# Patient Record
Sex: Male | Born: 1970 | Race: White | Hispanic: No | Marital: Married | Smoking: Never smoker
Health system: Southern US, Community
[De-identification: ages and names within clinical notes are randomized; demographics above are authoritative.]

## PROBLEM LIST (undated history)

## (undated) DIAGNOSIS — I10 Essential (primary) hypertension: Secondary | ICD-10-CM

## (undated) DIAGNOSIS — Z9889 Other specified postprocedural states: Secondary | ICD-10-CM

## (undated) DIAGNOSIS — M1990 Unspecified osteoarthritis, unspecified site: Secondary | ICD-10-CM

## (undated) DIAGNOSIS — M94269 Chondromalacia, unspecified knee: Secondary | ICD-10-CM

## (undated) DIAGNOSIS — R112 Nausea with vomiting, unspecified: Secondary | ICD-10-CM

## (undated) DIAGNOSIS — S83249A Other tear of medial meniscus, current injury, unspecified knee, initial encounter: Secondary | ICD-10-CM

## (undated) HISTORY — PX: APPENDECTOMY: SHX54

## (undated) HISTORY — PX: INGUINAL HERNIA REPAIR: SHX194

## (undated) HISTORY — PX: UMBILICAL HERNIA REPAIR: SHX196

---

## 1997-12-20 ENCOUNTER — Encounter: Payer: Self-pay | Admitting: *Deleted

## 1997-12-20 ENCOUNTER — Ambulatory Visit (HOSPITAL_COMMUNITY): Admission: RE | Admit: 1997-12-20 | Discharge: 1997-12-21 | Payer: Self-pay | Admitting: *Deleted

## 1998-03-03 HISTORY — PX: BACK SURGERY: SHX140

## 2011-09-29 ENCOUNTER — Ambulatory Visit: Payer: Self-pay

## 2011-09-29 ENCOUNTER — Other Ambulatory Visit: Payer: Self-pay | Admitting: Family Medicine

## 2011-09-29 DIAGNOSIS — M25562 Pain in left knee: Secondary | ICD-10-CM

## 2011-10-02 DIAGNOSIS — S83249A Other tear of medial meniscus, current injury, unspecified knee, initial encounter: Secondary | ICD-10-CM

## 2011-10-02 DIAGNOSIS — M94269 Chondromalacia, unspecified knee: Secondary | ICD-10-CM

## 2011-10-02 HISTORY — DX: Chondromalacia, unspecified knee: M94.269

## 2011-10-02 HISTORY — DX: Other tear of medial meniscus, current injury, unspecified knee, initial encounter: S83.249A

## 2011-10-30 ENCOUNTER — Encounter (HOSPITAL_BASED_OUTPATIENT_CLINIC_OR_DEPARTMENT_OTHER): Payer: Self-pay | Admitting: *Deleted

## 2011-10-30 NOTE — Pre-Procedure Instructions (Signed)
Will do BMET and EKG DOS as pt. lives out of town

## 2011-11-04 NOTE — H&P (Signed)
Robert Munoz is an 41 y.o. male.   Chief Complaint: Left knee pain HPI: Patient returns today reporting that the stiffness in his left knee has improved but he continues have significant pain despite the use of the hinged brace.  An MRI scan is been accomplished showing extrusion of the medial meniscus and delamination of some of the cartilage off the medial femoral condyle.  The cortisone injection that was done on 02 October 2011 gave him good relief for about a week but now the pain has returned.  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    Past Medical History  Diagnosis Date  . Arthritis     left knee  . Hypertension     under control, has been on med. since age 67  . PONV (postoperative nausea and vomiting)   . Medial meniscus tear 10/2011    left  . 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 Back surgery 2000    lumbar  . Appendectomy age 31  . Inguinal hernia repair as a child  . Umbilical hernia repair 15 yrs. ago    History reviewed. No pertinent family history. Social History:  reports that he has never smoked. His smokeless tobacco use includes Snuff. He reports that he does not drink alcohol or use illicit drugs.  Allergies: No Known Allergies  No prescriptions prior to admission    No results found for this or any previous visit (from the past 48 hour(s)). No results found.  Review of Systems  Constitutional: Negative.   HENT: Negative.   Eyes: Negative.   Musculoskeletal: Positive for joint pain.  Skin: Negative.   All other systems reviewed and are negative.    Height 6\' 2"  (1.88 m), weight 140.615 kg (310 lb). Physical Exam  Constitutional: He is oriented to person, place, and time. He appears well-developed and well-nourished.  HENT:  Head: Normocephalic.  Eyes: Pupils are equal, round, and reactive to light.  Cardiovascular: Normal heart sounds.     Respiratory: Breath sounds normal.  GI: Soft.  Musculoskeletal: He exhibits tenderness.  Neurological: He is alert and oriented to person, place, and time.    Very tender along the medial joint line of the left knee, no palpable effusion, McMurray's test reproduces pain and there is crepitus as you taken through a range of motion  Assessment/Plan A: Symptomatic extrusion and tear of medial meniscus of left knee with delamination of the articular cartilage off the medial femoral condyle secondary to an injury at that occurred at work.  P: I am asking permission for arthroscopic evaluation and treatment of the left knee with removal of delaminated and torn cartilage, as well as probable debridement of the extruded medial meniscal tear.  In the meantime he'll remain at seated work with frequent changes in position.  He we'll continue Advil as an anti-inflammatory medication.  Micayla Brathwaite M. 11/04/2011, 10:42 AM

## 2011-11-05 ENCOUNTER — Encounter (HOSPITAL_BASED_OUTPATIENT_CLINIC_OR_DEPARTMENT_OTHER): Payer: Self-pay | Admitting: *Deleted

## 2011-11-05 ENCOUNTER — Encounter (HOSPITAL_BASED_OUTPATIENT_CLINIC_OR_DEPARTMENT_OTHER): Payer: Self-pay | Admitting: Anesthesiology

## 2011-11-05 ENCOUNTER — Ambulatory Visit (HOSPITAL_BASED_OUTPATIENT_CLINIC_OR_DEPARTMENT_OTHER)
Admission: RE | Admit: 2011-11-05 | Discharge: 2011-11-05 | Disposition: A | Discharge status: Home / Self Care | Payer: Worker's Compensation | Source: Ambulatory Visit | Attending: Orthopedic Surgery | Admitting: Orthopedic Surgery

## 2011-11-05 ENCOUNTER — Ambulatory Visit (HOSPITAL_BASED_OUTPATIENT_CLINIC_OR_DEPARTMENT_OTHER): Payer: Worker's Compensation | Admitting: Anesthesiology

## 2011-11-05 ENCOUNTER — Encounter (HOSPITAL_BASED_OUTPATIENT_CLINIC_OR_DEPARTMENT_OTHER)
Admission: RE | Disposition: A | Discharge status: Home / Self Care | Payer: Self-pay | Source: Ambulatory Visit | Attending: Orthopedic Surgery

## 2011-11-05 DIAGNOSIS — I10 Essential (primary) hypertension: Secondary | ICD-10-CM | POA: Insufficient documentation

## 2011-11-05 DIAGNOSIS — M2240 Chondromalacia patellae, unspecified knee: Secondary | ICD-10-CM | POA: Insufficient documentation

## 2011-11-05 DIAGNOSIS — M2340 Loose body in knee, unspecified knee: Secondary | ICD-10-CM | POA: Insufficient documentation

## 2011-11-05 DIAGNOSIS — Z9089 Acquired absence of other organs: Secondary | ICD-10-CM | POA: Insufficient documentation

## 2011-11-05 DIAGNOSIS — S83209A Unspecified tear of unspecified meniscus, current injury, unspecified knee, initial encounter: Secondary | ICD-10-CM

## 2011-11-05 DIAGNOSIS — M129 Arthropathy, unspecified: Secondary | ICD-10-CM | POA: Insufficient documentation

## 2011-11-05 HISTORY — DX: Essential (primary) hypertension: I10

## 2011-11-05 HISTORY — DX: Nausea with vomiting, unspecified: R11.2

## 2011-11-05 HISTORY — PX: CHONDROPLASTY: SHX5177

## 2011-11-05 HISTORY — PX: KNEE ARTHROSCOPY: SHX127

## 2011-11-05 HISTORY — DX: Other tear of medial meniscus, current injury, unspecified knee, initial encounter: S83.249A

## 2011-11-05 HISTORY — DX: Other specified postprocedural states: Z98.890

## 2011-11-05 HISTORY — DX: Unspecified osteoarthritis, unspecified site: M19.90

## 2011-11-05 HISTORY — DX: Chondromalacia, unspecified knee: M94.269

## 2011-11-05 LAB — POCT I-STAT, CHEM 8
Calcium, Ion: 1.13 mmol/L (ref 1.12–1.23)
Chloride: 103 mEq/L (ref 96–112)
HCT: 43 % (ref 39.0–52.0)
Potassium: 3.1 mEq/L — ABNORMAL LOW (ref 3.5–5.1)
Sodium: 140 mEq/L (ref 135–145)

## 2011-11-05 SURGERY — ARTHROSCOPY, KNEE
Anesthesia: General | Site: Knee | Laterality: Left | Wound class: Clean

## 2011-11-05 MED ORDER — HYDROCODONE-ACETAMINOPHEN 5-325 MG PO TABS: 1.0000 | ORAL_TABLET | ORAL | Status: AC | PRN

## 2011-11-05 MED ORDER — DEXAMETHASONE SODIUM PHOSPHATE 4 MG/ML IJ SOLN
INTRAMUSCULAR | Status: DC | PRN
  Administered 2011-11-05: 10 mg via INTRAVENOUS

## 2011-11-05 MED ORDER — MIDAZOLAM HCL 5 MG/5ML IJ SOLN
INTRAMUSCULAR | Status: DC | PRN
  Administered 2011-11-05: 2 mg via INTRAVENOUS

## 2011-11-05 MED ORDER — METOCLOPRAMIDE HCL 5 MG/ML IJ SOLN: 10.0000 mg | Freq: Once | INTRAMUSCULAR | Status: DC | PRN

## 2011-11-05 MED ORDER — BUPIVACAINE-EPINEPHRINE 0.5% -1:200000 IJ SOLN
INTRAMUSCULAR | Status: DC | PRN
  Administered 2011-11-05: 20 mL

## 2011-11-05 MED ORDER — LIDOCAINE HCL (CARDIAC) 20 MG/ML IV SOLN
INTRAVENOUS | Status: DC | PRN
  Administered 2011-11-05: 75 mg via INTRAVENOUS
  Administered 2011-11-05: 50 mg via INTRAVENOUS

## 2011-11-05 MED ORDER — FENTANYL CITRATE 0.05 MG/ML IJ SOLN
INTRAMUSCULAR | Status: DC | PRN
  Administered 2011-11-05: 50 ug via INTRAVENOUS
  Administered 2011-11-05: 100 ug via INTRAVENOUS

## 2011-11-05 MED ORDER — OXYCODONE HCL 5 MG PO TABS: 5.0000 mg | ORAL_TABLET | Freq: Once | ORAL | Status: DC | PRN

## 2011-11-05 MED ORDER — LACTATED RINGERS IV SOLN
INTRAVENOUS | Status: DC
  Administered 2011-11-05 (×2): via INTRAVENOUS

## 2011-11-05 MED ORDER — SODIUM CHLORIDE 0.9 % IR SOLN
Status: DC | PRN
  Administered 2011-11-05: 12:00:00

## 2011-11-05 MED ORDER — ONDANSETRON HCL 4 MG/2ML IJ SOLN
INTRAMUSCULAR | Status: DC | PRN
  Administered 2011-11-05: 4 mg via INTRAVENOUS

## 2011-11-05 MED ORDER — PROPOFOL 10 MG/ML IV BOLUS
INTRAVENOUS | Status: DC | PRN
  Administered 2011-11-05: 300 mg via INTRAVENOUS

## 2011-11-05 MED ORDER — OXYCODONE HCL 5 MG/5ML PO SOLN: 5.0000 mg | Freq: Once | ORAL | Status: DC | PRN

## 2011-11-05 MED ORDER — HYDROMORPHONE HCL PF 1 MG/ML IJ SOLN
0.2500 mg | INTRAMUSCULAR | Status: DC | PRN
  Administered 2011-11-05: 0.5 mg via INTRAVENOUS

## 2011-11-05 MED ORDER — SCOPOLAMINE 1 MG/3DAYS TD PT72
1.0000 | MEDICATED_PATCH | Freq: Once | TRANSDERMAL | Status: DC
  Administered 2011-11-05: 1.5 mg via TRANSDERMAL

## 2011-11-05 SURGICAL SUPPLY — 43 items
BANDAGE ELASTIC 6 VELCRO ST LF (GAUZE/BANDAGES/DRESSINGS) ×2 IMPLANT
BLADE 4.2CUDA (BLADE) IMPLANT
BLADE CUTTER GATOR 3.5 (BLADE) ×1 IMPLANT
BLADE GREAT WHITE 4.2 (BLADE) IMPLANT
CANISTER OMNI JUG 16 LITER (MISCELLANEOUS) ×1 IMPLANT
CANISTER SUCTION 2500CC (MISCELLANEOUS) IMPLANT
CHLORAPREP W/TINT 26ML (MISCELLANEOUS) ×2 IMPLANT
CLOTH BEACON ORANGE TIMEOUT ST (SAFETY) ×2 IMPLANT
DRAPE ARTHROSCOPY W/POUCH 114 (DRAPES) ×2 IMPLANT
ELECT MENISCUS 165MM 90D (ELECTRODE) IMPLANT
ELECT REM PT RETURN 9FT ADLT (ELECTROSURGICAL)
ELECTRODE REM PT RTRN 9FT ADLT (ELECTROSURGICAL) IMPLANT
GAUZE XEROFORM 1X8 LF (GAUZE/BANDAGES/DRESSINGS) ×2 IMPLANT
GLOVE BIO SURGEON STRL SZ 6.5 (GLOVE) ×1 IMPLANT
GLOVE BIO SURGEON STRL SZ7 (GLOVE) ×2 IMPLANT
GLOVE BIO SURGEON STRL SZ7.5 (GLOVE) ×2 IMPLANT
GLOVE BIOGEL PI IND STRL 6.5 (GLOVE) IMPLANT
GLOVE BIOGEL PI IND STRL 7.0 (GLOVE) ×1 IMPLANT
GLOVE BIOGEL PI IND STRL 8 (GLOVE) ×1 IMPLANT
GLOVE BIOGEL PI INDICATOR 6.5 (GLOVE) ×1
GLOVE BIOGEL PI INDICATOR 7.0 (GLOVE) ×1
GLOVE BIOGEL PI INDICATOR 8 (GLOVE) ×1
GLOVE SKINSENSE NS SZ7.0 (GLOVE) ×1
GLOVE SKINSENSE STRL SZ7.0 (GLOVE) IMPLANT
GOWN PREVENTION PLUS XLARGE (GOWN DISPOSABLE) ×4 IMPLANT
KNEE WRAP E Z 3 GEL PACK (MISCELLANEOUS) ×2 IMPLANT
NDL FILTER BLUNT 18X1 1/2 (NEEDLE) ×1 IMPLANT
NDL SAFETY ECLIPSE 18X1.5 (NEEDLE) IMPLANT
NEEDLE FILTER BLUNT 18X 1/2SAF (NEEDLE) ×1
NEEDLE FILTER BLUNT 18X1 1/2 (NEEDLE) ×1 IMPLANT
NEEDLE HYPO 18GX1.5 SHARP (NEEDLE)
PACK ARTHROSCOPY DSU (CUSTOM PROCEDURE TRAY) ×2 IMPLANT
PACK BASIN DAY SURGERY FS (CUSTOM PROCEDURE TRAY) ×2 IMPLANT
PENCIL BUTTON HOLSTER BLD 10FT (ELECTRODE) IMPLANT
SET ARTHROSCOPY TUBING (MISCELLANEOUS) ×2
SET ARTHROSCOPY TUBING LN (MISCELLANEOUS) ×1 IMPLANT
SLEEVE SCD COMPRESS KNEE MED (MISCELLANEOUS) IMPLANT
SPONGE GAUZE 4X4 12PLY (GAUZE/BANDAGES/DRESSINGS) ×2 IMPLANT
SYR 3ML 18GX1 1/2 (SYRINGE) IMPLANT
SYR 5ML LL (SYRINGE) ×2 IMPLANT
TOWEL OR 17X24 6PK STRL BLUE (TOWEL DISPOSABLE) ×2 IMPLANT
WAND STAR VAC 90 (SURGICAL WAND) IMPLANT
WATER STERILE IRR 1000ML POUR (IV SOLUTION) ×2 IMPLANT

## 2011-11-05 NOTE — Interval H&P Note (Signed)
History and Physical Interval Note:  11/05/2011 11:04 AM  Robert Munoz  has presented today for surgery, with the diagnosis of Left knee medial meniscus tear and chondromalacia  The various methods of treatment have been discussed with the patient and family. After consideration of risks, benefits and other options for treatment, the patient has consented to  Procedure(s) (LRB) with comments: ARTHROSCOPY KNEE (Left) - Left knee scope  as a surgical intervention .  The patient's history has been reviewed, patient examined, no change in status, stable for surgery.  I have reviewed the patient's chart and labs.  Questions were answered to the patient's satisfaction.     Nestor Lewandowsky

## 2011-11-05 NOTE — Anesthesia Preprocedure Evaluation (Signed)
Anesthesia Evaluation  Patient identified by MRN, date of birth, ID band Patient awake    Reviewed: Allergy & Precautions, H&P , NPO status , Patient's Chart, lab work & pertinent test results, reviewed documented beta blocker date and time   History of Anesthesia Complications (+) PONV  Airway Mallampati: II TM Distance: >3 FB Neck ROM: full    Dental   Pulmonary neg pulmonary ROS,  breath sounds clear to auscultation        Cardiovascular hypertension, On Medications and On Home Beta Blockers Rhythm:regular     Neuro/Psych negative neurological ROS  negative psych ROS   GI/Hepatic negative GI ROS, Neg liver ROS,   Endo/Other  Morbid obesity  Renal/GU negative Renal ROS  negative genitourinary   Musculoskeletal   Abdominal   Peds  Hematology negative hematology ROS (+)   Anesthesia Other Findings See surgeon's H&P   Reproductive/Obstetrics negative OB ROS                           Anesthesia Physical Anesthesia Plan  ASA: III  Anesthesia Plan: General   Post-op Pain Management:    Induction: Intravenous  Airway Management Planned: LMA  Additional Equipment:   Intra-op Plan:   Post-operative Plan: Extubation in OR  Informed Consent: I have reviewed the patients History and Physical, chart, labs and discussed the procedure including the risks, benefits and alternatives for the proposed anesthesia with the patient or authorized representative who has indicated his/her understanding and acceptance.   Dental Advisory Given  Plan Discussed with: CRNA and Surgeon  Anesthesia Plan Comments:         Anesthesia Quick Evaluation

## 2011-11-05 NOTE — Transfer of Care (Signed)
Immediate Anesthesia Transfer of Care Note  Patient: Robert Munoz  Procedure(s) Performed: Procedure(s) (LRB) with comments: ARTHROSCOPY KNEE (Left) - Left knee scope , removal of fiberous band CHONDROPLASTY (Left) - Medial Femoral Condyle and Removal Loose Bodies  Patient Location: PACU  Anesthesia Type: General  Level of Consciousness: awake, alert  and oriented  Airway & Oxygen Therapy: Patient Spontanous Breathing and Patient connected to face mask oxygen  Post-op Assessment: Report given to PACU RN and Post -op Vital signs reviewed and stable  Post vital signs: Reviewed and stable  Complications: No apparent anesthesia complications

## 2011-11-05 NOTE — Anesthesia Procedure Notes (Addendum)
Date/Time: 11/05/2011 11:17 AM Performed by: York Grice   Procedure Name: LMA Insertion Date/Time: 11/05/2011 11:17 AM Performed by: Zenia Resides D Pre-anesthesia Checklist: Patient identified, Emergency Drugs available, Suction available, Patient being monitored and Timeout performed Patient Re-evaluated:Patient Re-evaluated prior to inductionOxygen Delivery Method: Circle System Utilized Preoxygenation: Pre-oxygenation with 100% oxygen Intubation Type: IV induction Ventilation: Mask ventilation without difficulty LMA: LMA with gastric port inserted LMA Size: 5.0 Number of attempts: 1 Placement Confirmation: positive ETCO2 and breath sounds checked- equal and bilateral Tube secured with: Tape Dental Injury: Teeth and Oropharynx as per pre-operative assessment

## 2011-11-05 NOTE — Anesthesia Postprocedure Evaluation (Signed)
Anesthesia Post Note  Patient: Robert Munoz  Procedure(s) Performed: Procedure(s) (LRB): ARTHROSCOPY KNEE (Left) CHONDROPLASTY (Left)  Anesthesia type: General  Patient location: PACU  Post pain: Pain level controlled  Post assessment: Patient's Cardiovascular Status Stable  Last Vitals:  Filed Vitals:   11/05/11 1300  BP: 178/88  Pulse: 52  Temp: 36.4 C  Resp: 18    Post vital signs: Reviewed and stable  Level of consciousness: alert  Complications: No apparent anesthesia complications

## 2011-11-05 NOTE — Op Note (Signed)
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  Surgeon: Feliberto Gottron. Robert Munoz M.D.  Assist: Shirl Harris PA-C  Anes: General LMA  EBL: Minimal  Fluids: 800 cc   Indications: Patient was injured at work and MRI scan shows chondromalacia of the medial compartment trochlea of the left knee [place] and with his catching popping and pain the medial meniscus is also extruded and may be torn. Pt has failed conservative treatment with anti-inflammatory medicines, physical therapy, and modified activites but did get good temporarily from an intra-articular cortisone injection. Pain has recurred and patient desires elective arthroscopic evaluation and treatment of knee. Risks and benefits of surgery have been discussed and questions answered.  Procedure: Patient identified by arm band and taken to the operating room at the day surgery Center. The appropriate anesthetic monitors were attached, and General LMA anesthesia was induced without difficulty. Lateral post was applied to the table and the lower extremity was prepped and draped in usual sterile fashion from the ankle to the midthigh. Time out procedure was performed. We began the operation by making standard inferior lateral and inferior medial peripatellar portals with a #11 blade allowing introduction of the arthroscope through the inferior lateral portal and the out flow to the inferior medial portal. Pump pressure was set at 100 mmHg and diagnostic arthroscopy  revealed multiple cartilaginous loose bodies in the suprapatellar pouch and in the gutters. These were removed with the out flow and a 3.5 mm  Gator sucker shaver. The trochlea had grade 3 and focal grade 4 chondromalacia with flap tears and these are debridement back to a stable margin with a straight biter and a 35 Gator sucker shaver. The medial compartment had primarily grade 3 chondromalacia of the medial femoral condyle and this was likewise abraded back to a stable margin. The anterior aspect of the medial meniscus was extruded but there were no overt tears. The anterior cruciate ligament and PCL are intact. On the lateral side the articular and meniscal cartilages were in excellent condition. We found a few more intra-articular loose bodies on the lateral side and likewise these were removed with the outflow and the 35 Gator sucker shaver.. The knee was irrigated out normal saline solution. A dressing of xerofoam 4 x 4 dressing sponges, web roll and an Ace wrap was applied. The patient was awakened extubated and taken to the recovery without difficulty.    Signed: Nestor Lewandowsky, MD

## 2011-11-06 ENCOUNTER — Encounter (HOSPITAL_BASED_OUTPATIENT_CLINIC_OR_DEPARTMENT_OTHER): Payer: Self-pay | Admitting: Orthopedic Surgery

## 2011-11-11 ENCOUNTER — Encounter (HOSPITAL_BASED_OUTPATIENT_CLINIC_OR_DEPARTMENT_OTHER): Payer: Self-pay

## 2014-09-29 ENCOUNTER — Emergency Department (INDEPENDENT_AMBULATORY_CARE_PROVIDER_SITE_OTHER)
Admission: EM | Admit: 2014-09-29 | Discharge: 2014-09-29 | Disposition: A | Discharge status: Home / Self Care | Payer: Self-pay | Source: Home / Self Care | Attending: Emergency Medicine | Admitting: Emergency Medicine

## 2014-09-29 ENCOUNTER — Emergency Department (INDEPENDENT_AMBULATORY_CARE_PROVIDER_SITE_OTHER): Payer: Self-pay

## 2014-09-29 ENCOUNTER — Encounter: Payer: Self-pay | Admitting: Emergency Medicine

## 2014-09-29 DIAGNOSIS — M25561 Pain in right knee: Secondary | ICD-10-CM

## 2014-09-29 DIAGNOSIS — S8391XA Sprain of unspecified site of right knee, initial encounter: Secondary | ICD-10-CM

## 2014-09-29 MED ORDER — HYDROCODONE-ACETAMINOPHEN 5-325 MG PO TABS: 1.0000 | ORAL_TABLET | Freq: Four times a day (QID) | ORAL | Status: AC | PRN

## 2014-09-29 NOTE — ED Notes (Signed)
This is a workers comp injury. Pt states he injured his right knee today at work. States he heard a pop and is having a lot of pain.

## 2014-09-29 NOTE — ED Provider Notes (Signed)
CSN: [id number]     Arrival date & time 09/29/14  1536 History   First MD Initiated Contact with Patient 09/29/14 1608     Chief Complaint  Patient presents with  . Knee Pain    HPI While on the job today, he states he injured his right knee and heard a pop . Pain is sharp, hurts to move right knee. Unable to weight-bear without severe pain in right knee. Prior history of left knee problems, and surgery but denies history of right knee problems. Denies chest pain or shortness of breath or nausea or vomiting. Past Medical History  Diagnosis Date  . Arthritis     left knee  . Hypertension     under control, has been on med. since age 32  . PONV (postoperative nausea and vomiting)   . Medial meniscus tear 10/2011    left  . 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 Back surgery  2000    lumbar  . Appendectomy  age 72  . Inguinal hernia repair  as a child  . Umbilical hernia repair  15 yrs. ago  . Knee arthroscopy  11/05/2011    Procedure: ARTHROSCOPY KNEE;  Surgeon: Nestor Lewandowsky, MD;  Location: [facility];  Service: Orthopedics;  Laterality: Left;  Left knee scope , removal of fiberous band  . Chondroplasty  11/05/2011    Procedure: CHONDROPLASTY;  Surgeon: Nestor Lewandowsky, MD;  Location: [facility];  Service: Orthopedics;  Laterality: Left;  Medial Femoral Condyle and Removal Loose Bodies   History reviewed. No pertinent family history. History  Substance Use Topics  . Smoking status: Never Smoker   . Smokeless tobacco: Current User    Types: Snuff  . Alcohol Use: No    Review of Systems Remainder of Review of Systems negative for acute change except as noted in the HPI.  Allergies  Review of patient's allergies indicates no known allergies.  Home Medications   Prior to Admission medications   Medication Sig Start Date End Date Taking? Authorizing Provider  fish oil-omega-3 fatty acids 1000  MG capsule Take 3 g by mouth daily.    Historical Provider, MD  hydrochlorothiazide (HYDRODIURIL) 12.5 MG tablet Take 12.5 mg by mouth daily.    Historical Provider, MD  HYDROcodone-acetaminophen (NORCO/VICODIN) 5-325 MG per tablet Take 1-2 tablets by mouth every 6 (six) hours as needed for severe pain. Take with food. Note to pharmacist: Worker's Compensation injury 09/29/14   Lajean Manes, MD  ibuprofen (ADVIL,MOTRIN) 200 MG tablet Take 200 mg by mouth every 6 (six) hours as needed.    Historical Provider, MD  losartan (COZAAR) 50 MG tablet Take 50 mg by mouth daily.    Historical Provider, MD  metoprolol (LOPRESSOR) 50 MG tablet Take 50 mg by mouth 2 (two) times daily.    Historical Provider, MD  sertraline (ZOLOFT) 50 MG tablet Take 50 mg by mouth daily.    Historical Provider, MD  telmisartan (MICARDIS) 80 MG tablet Take 80 mg by mouth daily.    Historical Provider, MD   BP 141/70 mmHg  Pulse 63  Temp(Src) 98 F (36.7 C)  SpO2 95% Physical Exam  Constitutional: He is oriented to person, place, and time. He appears well-developed and well-nourished. No distress.  Uncomfortable from right knee pain  HENT:  Head: Normocephalic and atraumatic.  Eyes: Conjunctivae and EOM are normal. Pupils are equal, round,  and reactive to light. No scleral icterus.  Neck: Normal range of motion.  Cardiovascular: Normal rate.   Pulmonary/Chest: Effort normal.  Abdominal: He exhibits no distension.  Musculoskeletal:       Right knee: He exhibits decreased range of motion, swelling and bony tenderness. He exhibits no ecchymosis and no laceration. Tenderness found. No medial joint line, no lateral joint line and no patellar tendon tenderness noted.       Legs: Neurological: He is alert and oriented to person, place, and time.  Skin: Skin is warm.  Psychiatric: He has a normal mood and affect.  Nursing note and vitals reviewed.   ED Course  Procedures (including critical care time) Labs Review Labs  Reviewed - No data to display  Imaging Review Dg Knee Complete 4 Views Right  09/29/2014   CLINICAL DATA:  44 year old male with right knee injury today with acute pain.  EXAM: RIGHT KNEE - COMPLETE 4+ VIEW  COMPARISON:  None.  FINDINGS: There is no evidence of acute fracture, subluxation or dislocation.  Mild degenerative changes are noted, greatest in the medial compartment.  There is no evidence of knee effusion.  No focal bony lesions are identified.  IMPRESSION: No acute abnormalities.  Mild degenerative changes, greatest in the medial compartment.   Electronically Signed   By: Harmon Pier M.D.   On: 09/29/2014 16:53     MDM   1. Right knee sprain, initial encounter    right knee injury. Because of significant pain and decreased range of motion, very difficult to assess if he's had internal derangement of right knee.  For now, we'll treat conservatively with Encourage rest, ice, compression with ACE bandage, and elevation of injured body part. Knee immobilizer provided. Vicodin for acute pain.--Printed prescription given to patient New Prescriptions   HYDROCODONE-ACETAMINOPHEN (NORCO/VICODIN) 5-325 MG PER TABLET    Take 1-2 tablets by mouth every 6 (six) hours as needed for severe pain. Take with food. Note to pharmacist: Worker's Compensation injury    He cannot take NSAIDs.   Return To Work:   Undetermined   Referral : Orthopedics. Within 4 days.  I will send this referral to be processed through [facility] employer health services at [facility] [place] office. Their phone number is [phone]      Lajean Manes, MD 09/29/14 [phone]

## 2016-07-06 IMAGING — CR DG KNEE COMPLETE 4+V*R*
4 series · 4 of 4 positions shown · non-contrast
Comparison: None.

CLINICAL DATA: 43-year-old male with right knee injury today with
acute pain.

EXAM:
RIGHT KNEE - COMPLETE 4+ VIEW

[knee ap (1 of 3)]
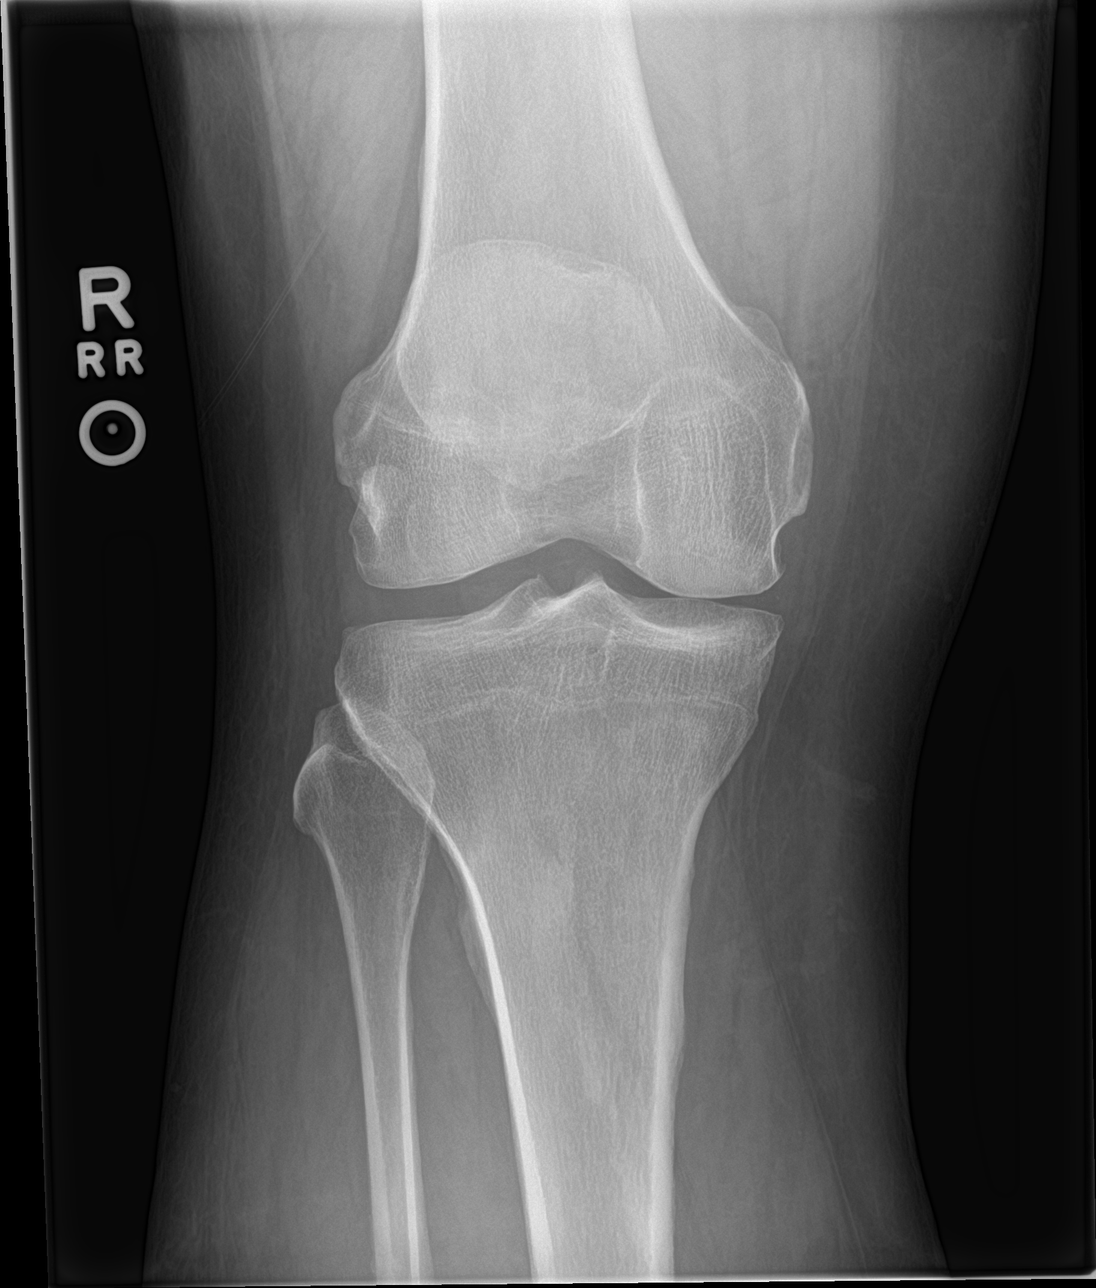

[knee lat]
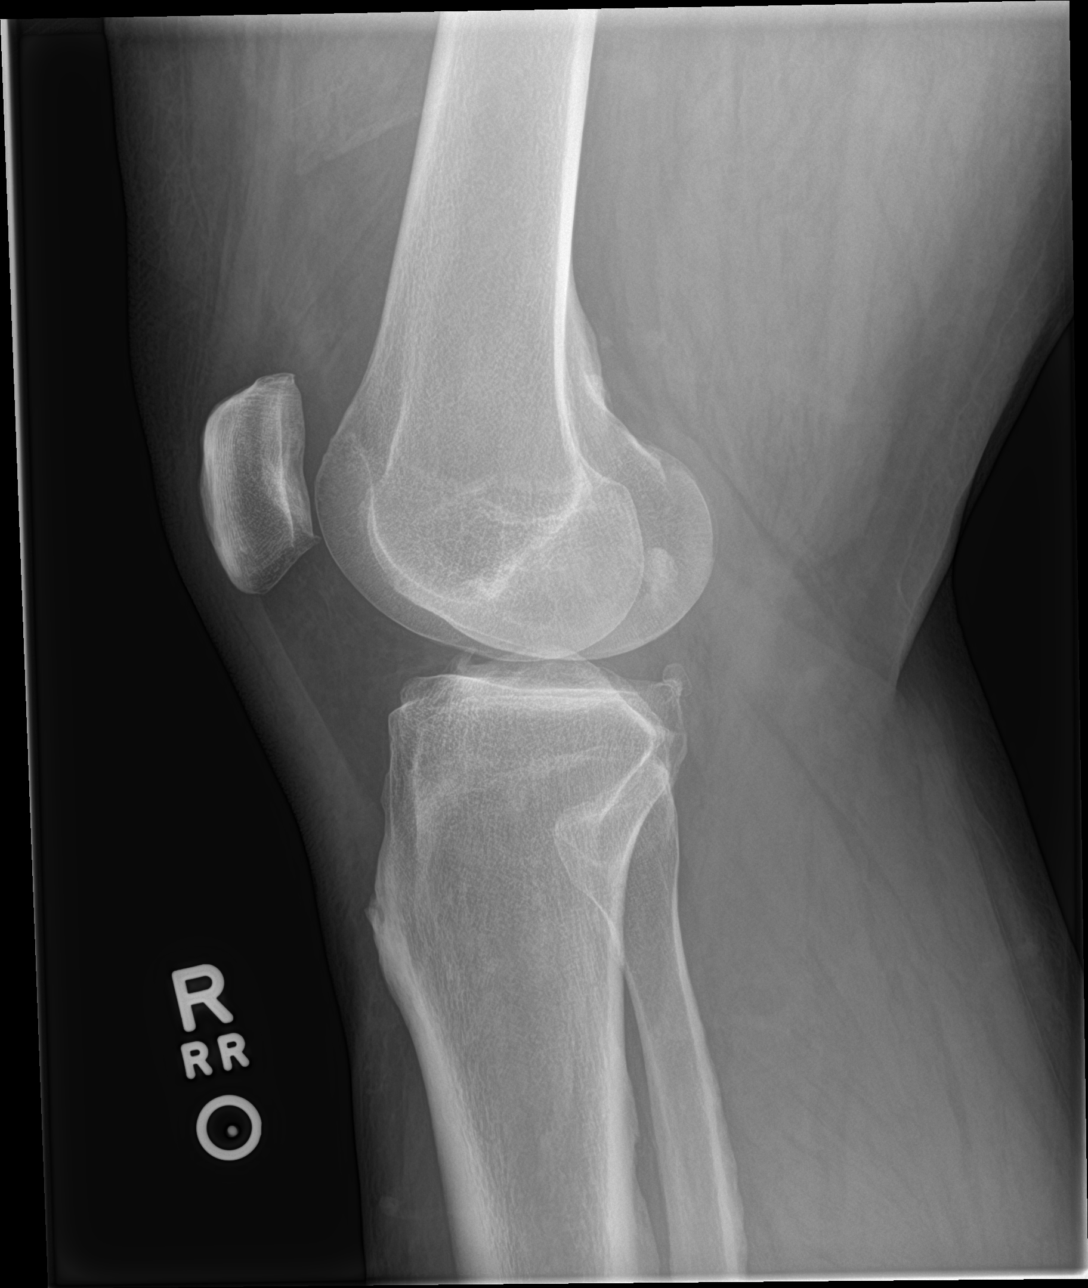

[knee ap (2 of 3)]
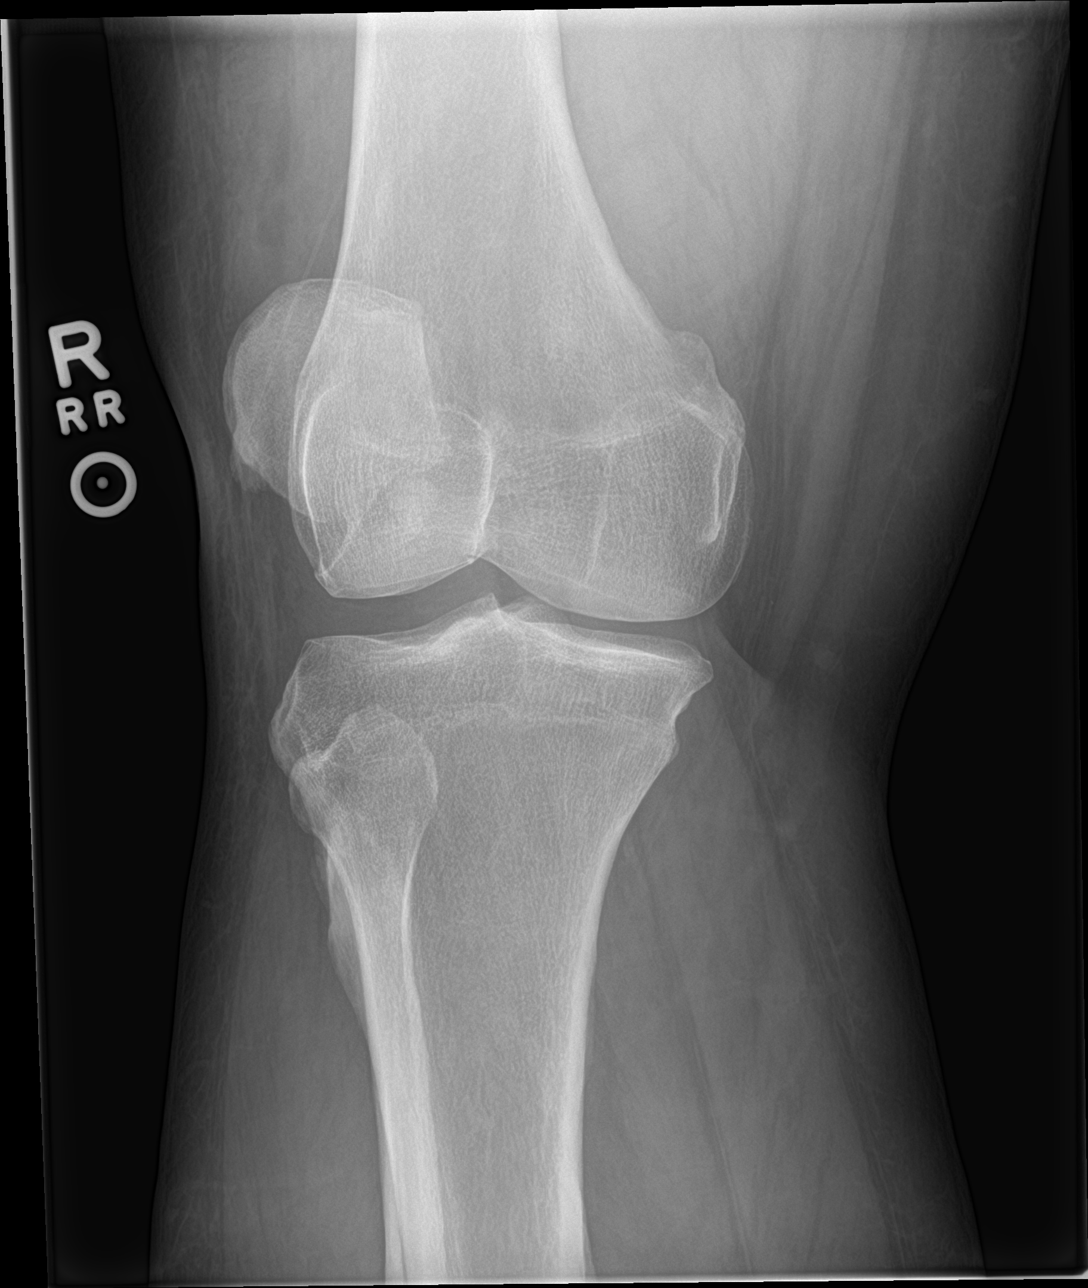

[knee ap (3 of 3)]
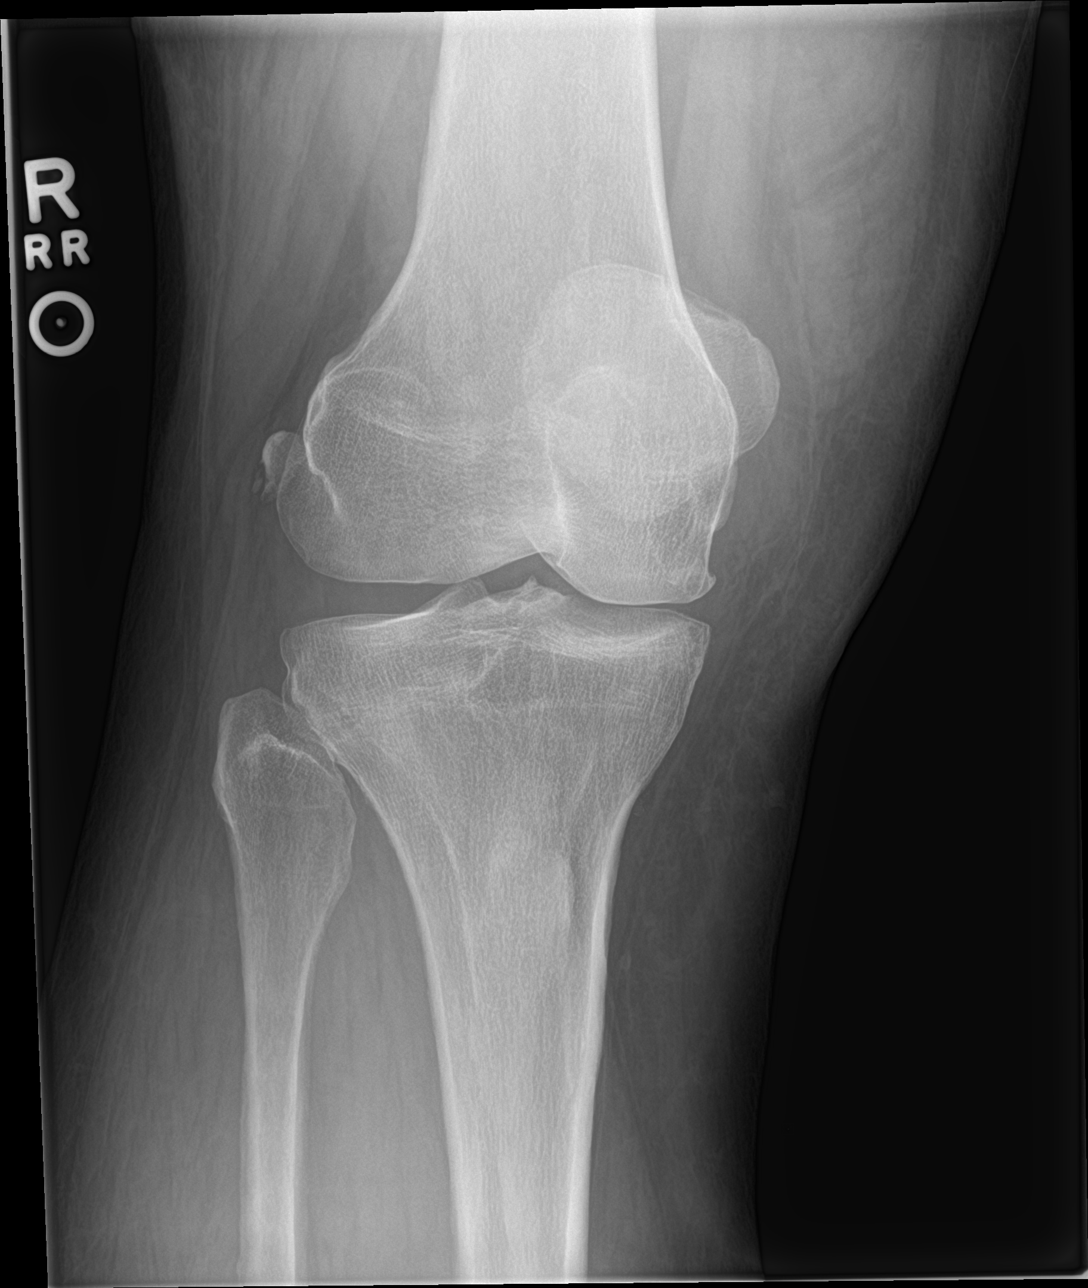

[4 of 4 positions shown; findings below may reference images not displayed]

FINDINGS: There is no evidence of acute fracture, subluxation or dislocation.

Mild degenerative changes are noted, greatest in the medial
compartment.

There is no evidence of knee effusion.

No focal bony lesions are identified.
IMPRESSION: No acute abnormalities.

Mild degenerative changes, greatest in the medial compartment.

## 2017-06-01 ENCOUNTER — Ambulatory Visit: Payer: Self-pay

## 2017-06-01 ENCOUNTER — Ambulatory Visit: Payer: Worker's Compensation

## 2017-06-01 ENCOUNTER — Ambulatory Visit (INDEPENDENT_AMBULATORY_CARE_PROVIDER_SITE_OTHER): Payer: Self-pay

## 2017-06-01 ENCOUNTER — Other Ambulatory Visit: Payer: Self-pay | Admitting: Emergency Medicine

## 2017-06-01 DIAGNOSIS — M4313 Spondylolisthesis, cervicothoracic region: Secondary | ICD-10-CM

## 2017-06-01 DIAGNOSIS — T1490XA Injury, unspecified, initial encounter: Secondary | ICD-10-CM

## 2019-03-09 IMAGING — DX DG CERVICAL SPINE 1V CLEARING
2 series · 2 of 2 positions shown · non-contrast
Comparison: None.

CLINICAL DATA: 46 y/o M; 4 stent deflection today with posterior
neck pain.

EXAM:
LIMITED CERVICAL SPINE FOR TRAUMA CLEARING - 1 VIEW

[c-spine lat]
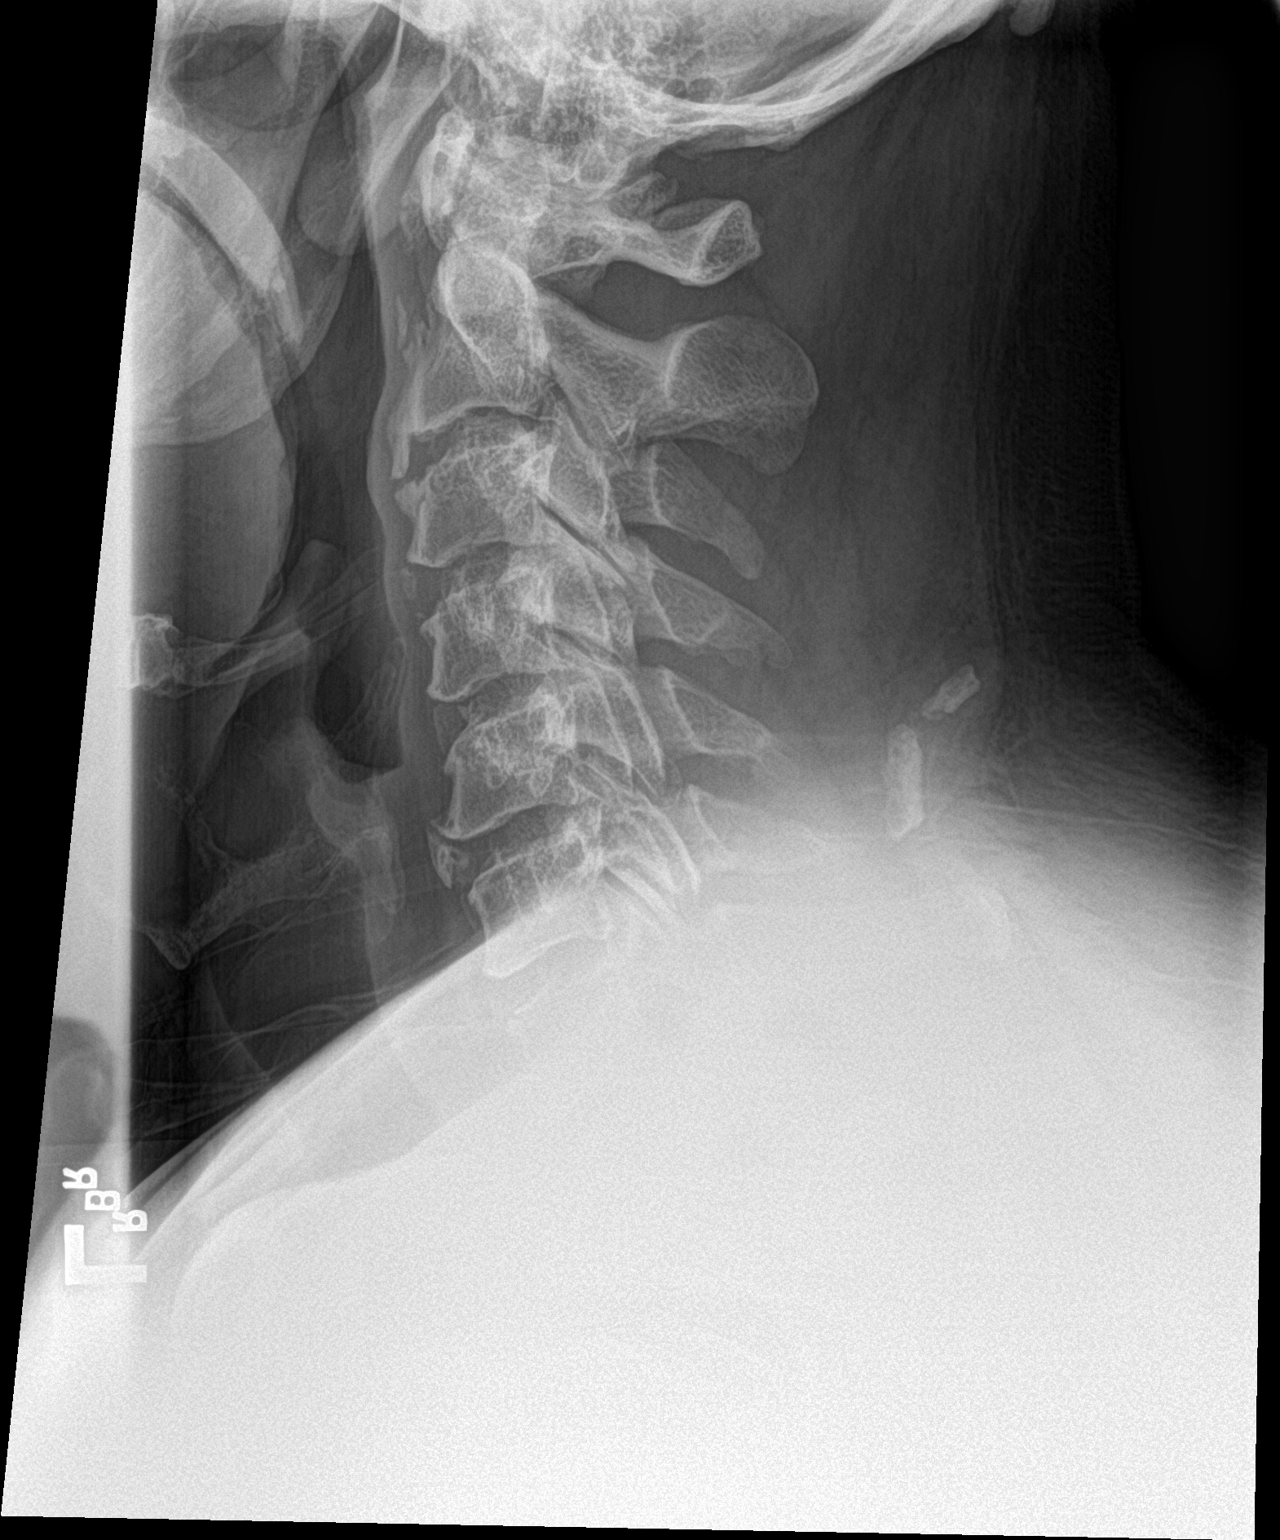

[c-spine swimmers]
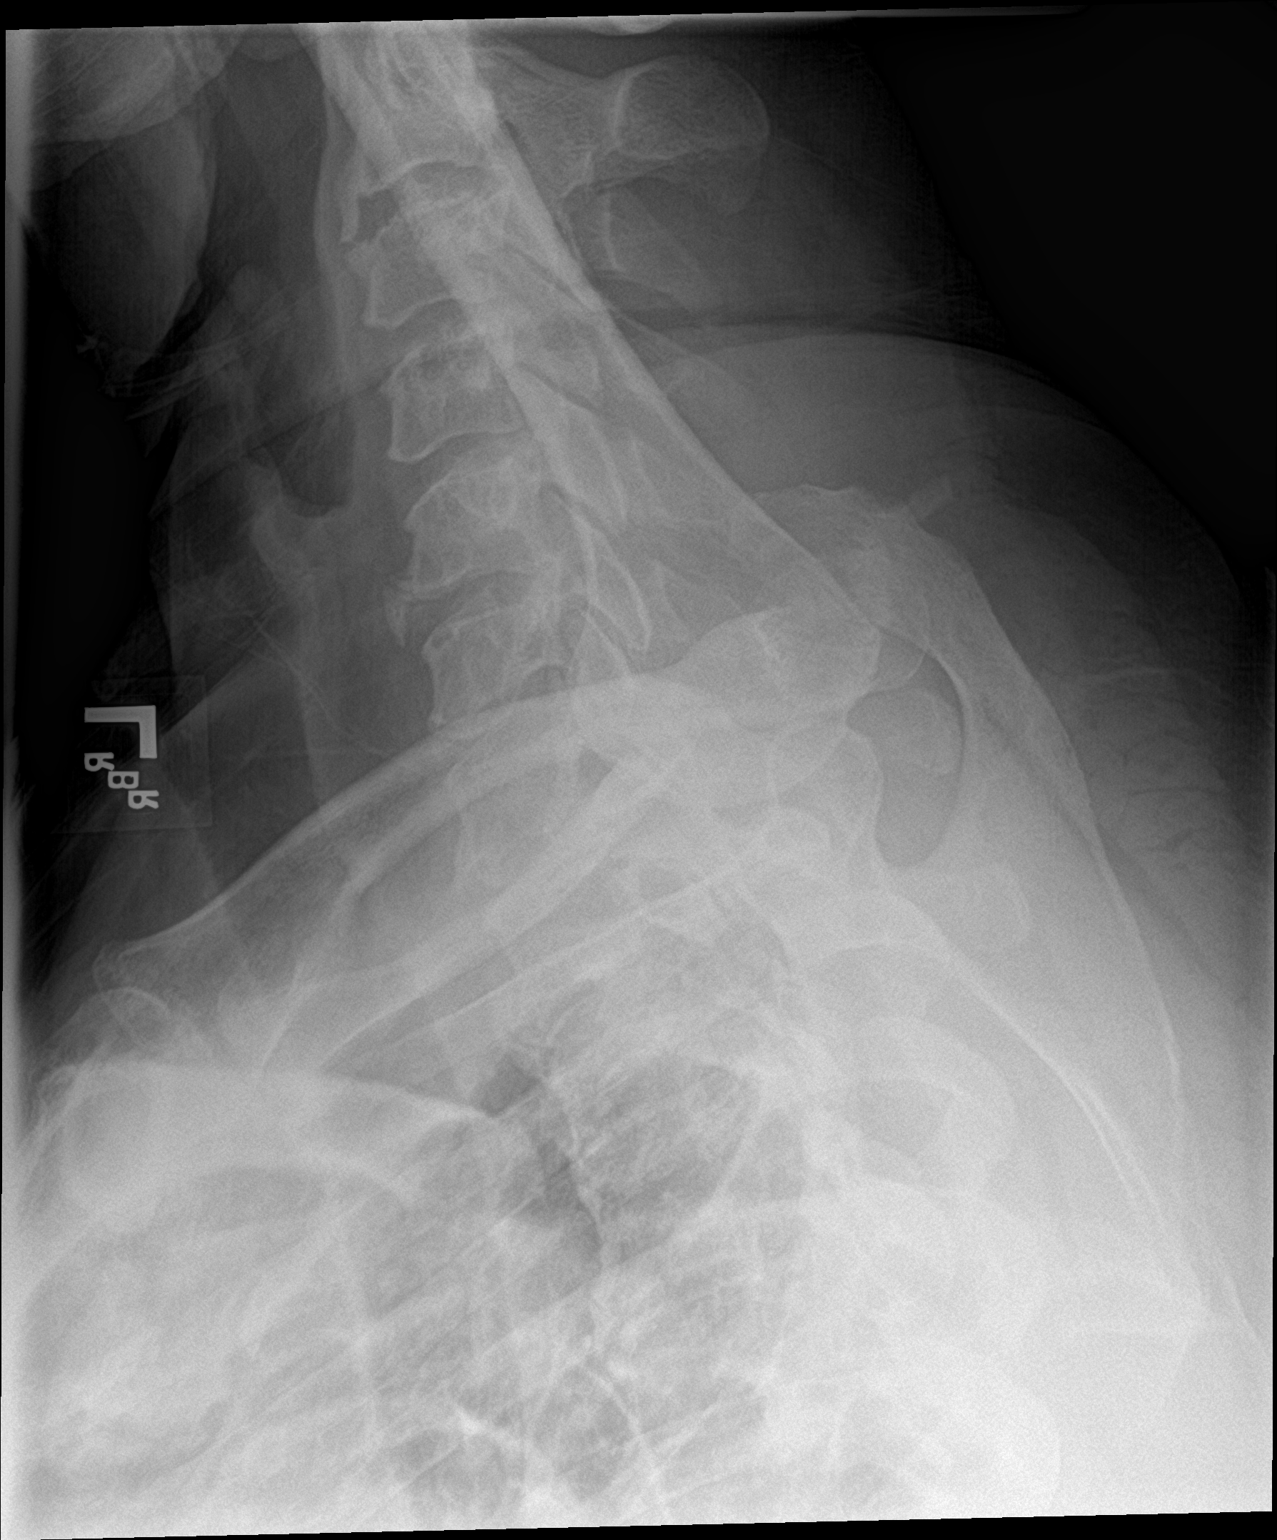

[2 of 2 positions shown; findings below may reference images not displayed]

FINDINGS: Lateral cervical spine radiograph and swimmer's view. Disc and
vertebral body heights are preserved. Grade 1 C7-T1 anterolisthesis.
Nuchal ligament calcifications. Mild multilevel discogenic
degenerative changes.
IMPRESSION: C7-T1 grade 1 anterolisthesis, this may be degenerative or
traumatic. Consider CT of cervical spine for further
characterization.

These results will be called to the ordering clinician or
representative by the Radiologist Assistant, and communication
documented in the PACS or zVision Dashboard.

By: Taishu Novoa M.D.
